# Patient Record
Sex: Female | Born: 1955 | Race: White | Hispanic: No | Marital: Married | Smoking: Never smoker
Health system: Southern US, Community
[De-identification: ages and names within clinical notes are randomized; demographics above are authoritative.]

## PROBLEM LIST (undated history)

## (undated) DIAGNOSIS — E079 Disorder of thyroid, unspecified: Secondary | ICD-10-CM

## (undated) DIAGNOSIS — E0590 Thyrotoxicosis, unspecified without thyrotoxic crisis or storm: Secondary | ICD-10-CM

## (undated) DIAGNOSIS — Z8639 Personal history of other endocrine, nutritional and metabolic disease: Secondary | ICD-10-CM

## (undated) HISTORY — DX: Personal history of other endocrine, nutritional and metabolic disease: Z86.39

## (undated) HISTORY — DX: Thyrotoxicosis, unspecified without thyrotoxic crisis or storm: E05.90

## (undated) HISTORY — DX: Disorder of thyroid, unspecified: E07.9

---

## 1998-06-11 ENCOUNTER — Other Ambulatory Visit: Admission: RE | Admit: 1998-06-11 | Discharge: 1998-06-11 | Payer: Self-pay | Admitting: Gynecology

## 1999-05-16 ENCOUNTER — Other Ambulatory Visit: Admission: RE | Admit: 1999-05-16 | Discharge: 1999-05-16 | Payer: Self-pay | Admitting: Gynecology

## 2000-04-20 ENCOUNTER — Encounter: Admission: RE | Admit: 2000-04-20 | Discharge: 2000-04-20 | Payer: Self-pay | Admitting: Sports Medicine

## 2000-06-01 ENCOUNTER — Encounter: Admission: RE | Admit: 2000-06-01 | Discharge: 2000-06-01 | Payer: Self-pay | Admitting: Sports Medicine

## 2000-07-06 ENCOUNTER — Other Ambulatory Visit: Admission: RE | Admit: 2000-07-06 | Discharge: 2000-07-06 | Payer: Self-pay | Admitting: Gynecology

## 2001-03-22 ENCOUNTER — Encounter: Admission: RE | Admit: 2001-03-22 | Discharge: 2001-03-22 | Payer: Self-pay | Admitting: Sports Medicine

## 2001-07-26 ENCOUNTER — Other Ambulatory Visit: Admission: RE | Admit: 2001-07-26 | Discharge: 2001-07-26 | Payer: Self-pay | Admitting: Gynecology

## 2002-08-12 ENCOUNTER — Other Ambulatory Visit: Admission: RE | Admit: 2002-08-12 | Discharge: 2002-08-12 | Payer: Self-pay | Admitting: Gynecology

## 2003-10-27 ENCOUNTER — Encounter: Admission: RE | Admit: 2003-10-27 | Discharge: 2003-10-27 | Payer: Self-pay | Admitting: Family Medicine

## 2004-08-22 ENCOUNTER — Encounter: Admission: RE | Admit: 2004-08-22 | Discharge: 2004-08-22 | Payer: Self-pay | Admitting: Cardiology

## 2005-01-17 ENCOUNTER — Ambulatory Visit: Payer: Self-pay | Admitting: Sports Medicine

## 2005-01-31 ENCOUNTER — Ambulatory Visit: Payer: Self-pay | Admitting: Sports Medicine

## 2005-03-17 ENCOUNTER — Ambulatory Visit: Payer: Self-pay | Admitting: Sports Medicine

## 2005-05-02 ENCOUNTER — Inpatient Hospital Stay (HOSPITAL_COMMUNITY): Admission: AD | Admit: 2005-05-02 | Discharge: 2005-05-03 | Payer: Self-pay | Admitting: Ophthalmology

## 2005-05-02 HISTORY — PX: RETINAL DETACHMENT SURGERY: SHX105

## 2005-06-05 ENCOUNTER — Other Ambulatory Visit: Admission: RE | Admit: 2005-06-05 | Discharge: 2005-06-05 | Payer: Self-pay | Admitting: Gynecology

## 2005-11-14 ENCOUNTER — Ambulatory Visit: Payer: Self-pay | Admitting: Sports Medicine

## 2005-11-19 IMAGING — CR DG CHEST 2V
2 series · 2 of 2 positions shown · non-contrast
Comparison: none

CLINICAL DATA: Cough.
 CHEST X-RAY:
 Two views of the chest show the lungs to be hyperaerated consistent with COPD.  The heart is within normal limits in size.  No bony abnormality is seen.

[view not recorded (1 of 2)]
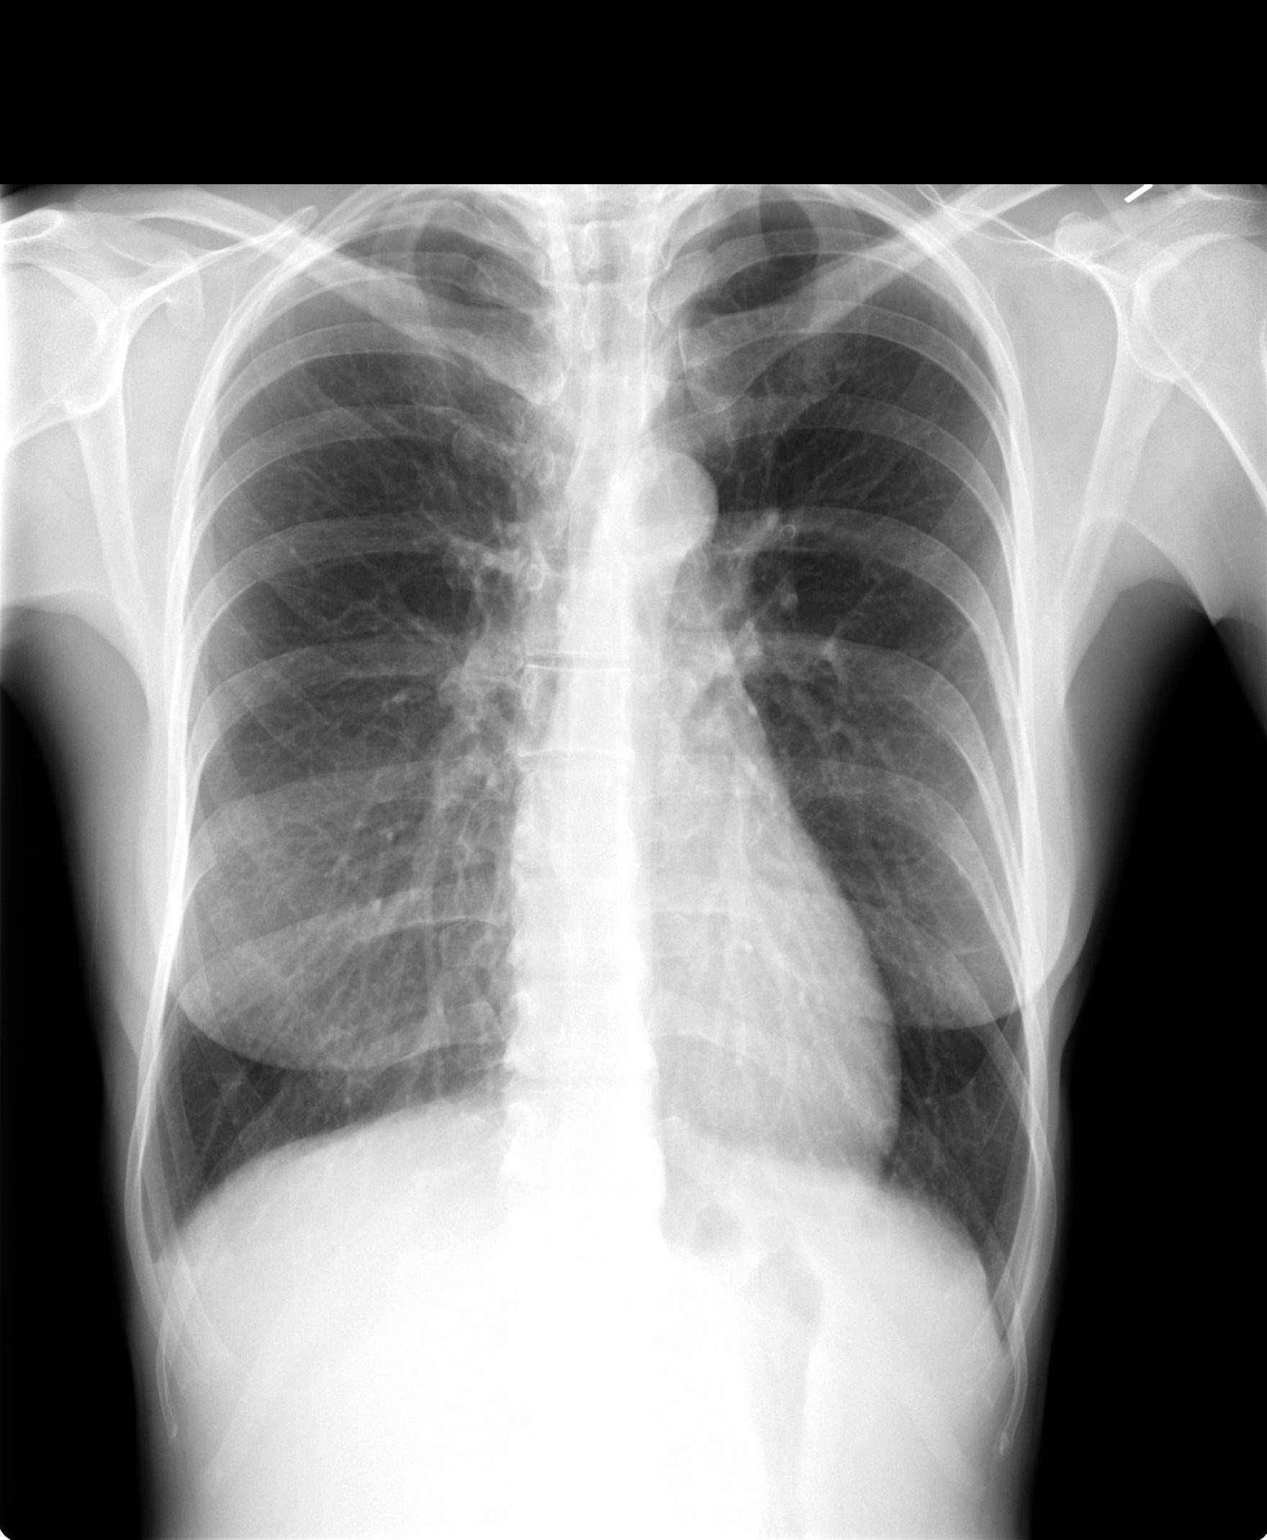

[view not recorded (2 of 2)]
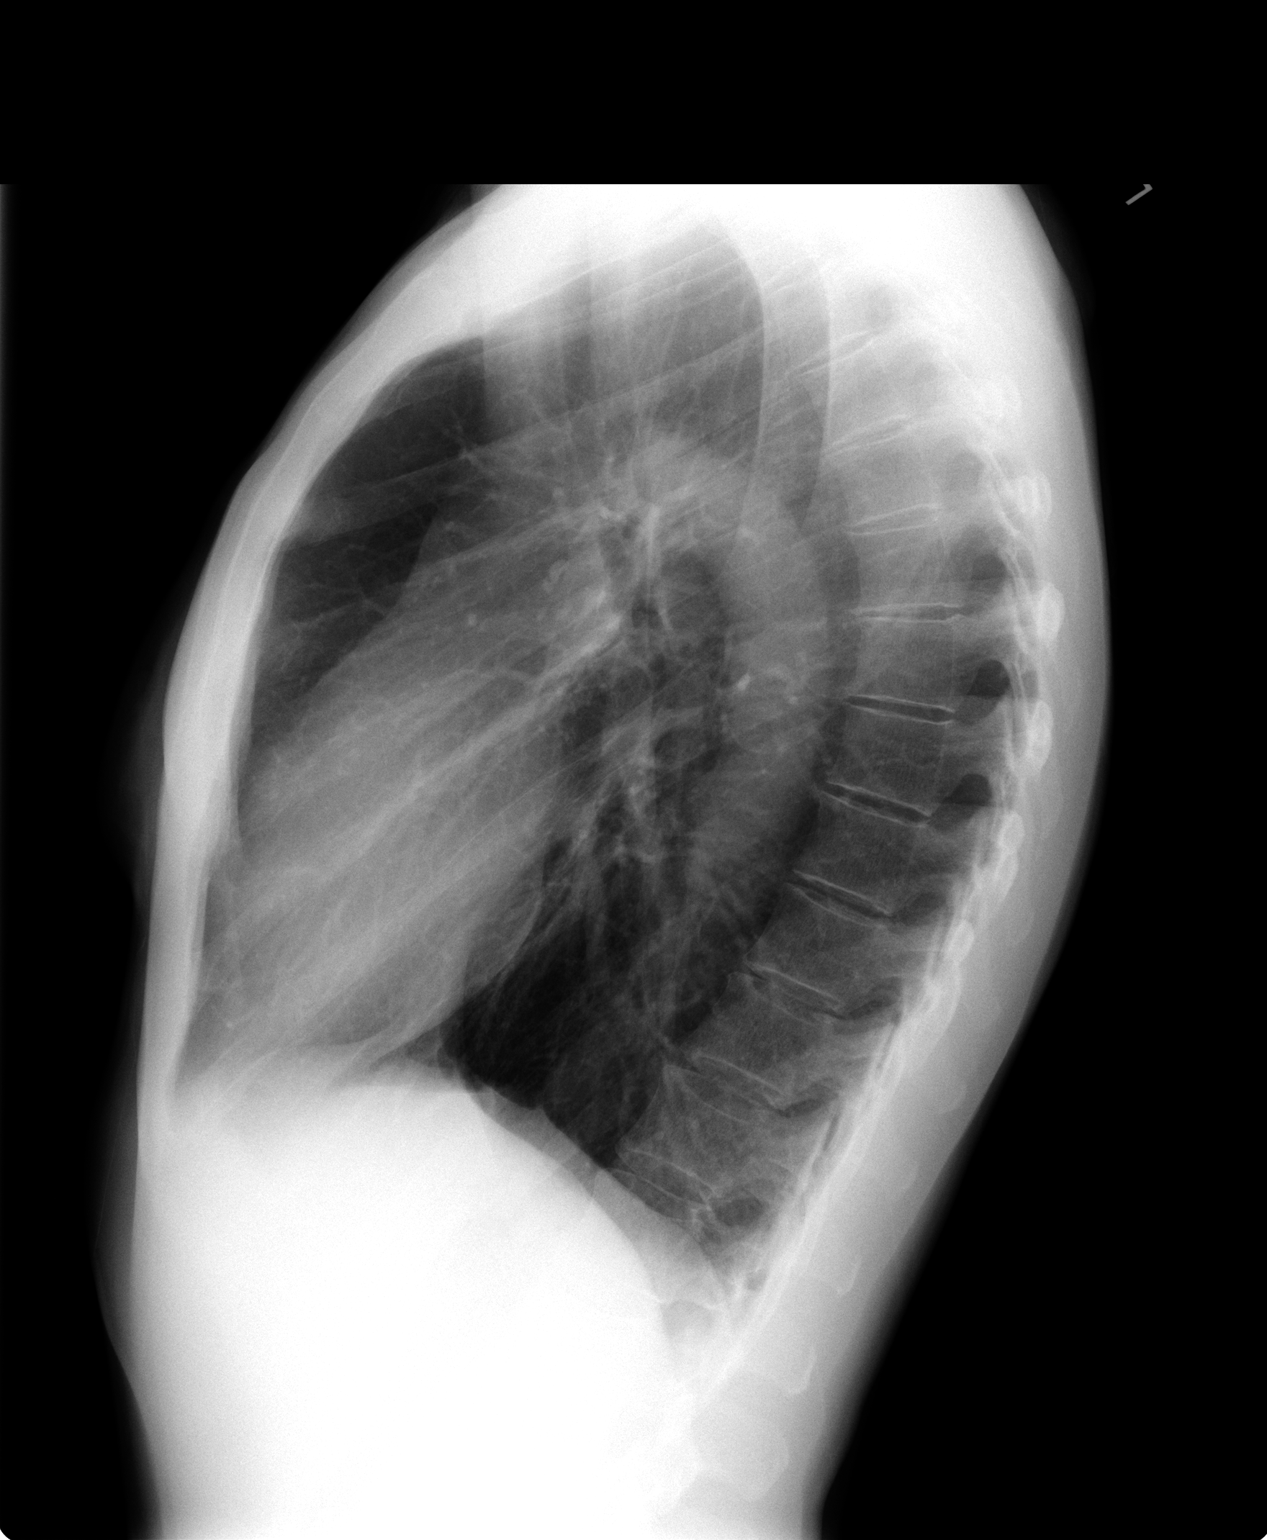

[2 of 2 positions shown; findings below may reference images not displayed]

IMPRESSION: Hyperaeration consistent with COPD.  No pneumonia.

## 2006-05-25 ENCOUNTER — Other Ambulatory Visit: Admission: RE | Admit: 2006-05-25 | Discharge: 2006-05-25 | Payer: Self-pay | Admitting: Gynecology

## 2006-06-18 ENCOUNTER — Emergency Department (HOSPITAL_COMMUNITY): Admission: EM | Admit: 2006-06-18 | Discharge: 2006-06-18 | Payer: Self-pay | Admitting: Emergency Medicine

## 2006-06-23 ENCOUNTER — Ambulatory Visit: Payer: Self-pay | Admitting: Sports Medicine

## 2006-07-30 IMAGING — CR DG CHEST 2V
2 series · 2 of 2 positions shown · non-contrast
Comparison: none

CLINICAL DATA: Pre-op for retinal detachment, scleral buckle left eye.  Non-smoker. No chest complaints. 
 CHEST - 2 VIEW: 
 PA and lateral views of the chest are made and are compared to previous studies of 08/22/04 and show mild diffuse peribronchial thickening, flattening of the hemidiaphragms and increased AP diameter of the chest.  There is some air trapping consistent with chronic obstructive pulmonary disease. There is no evidence of active disease.  The heart and mediastinum are normal.  The bony thorax is normal.

[view not recorded (1 of 2)]
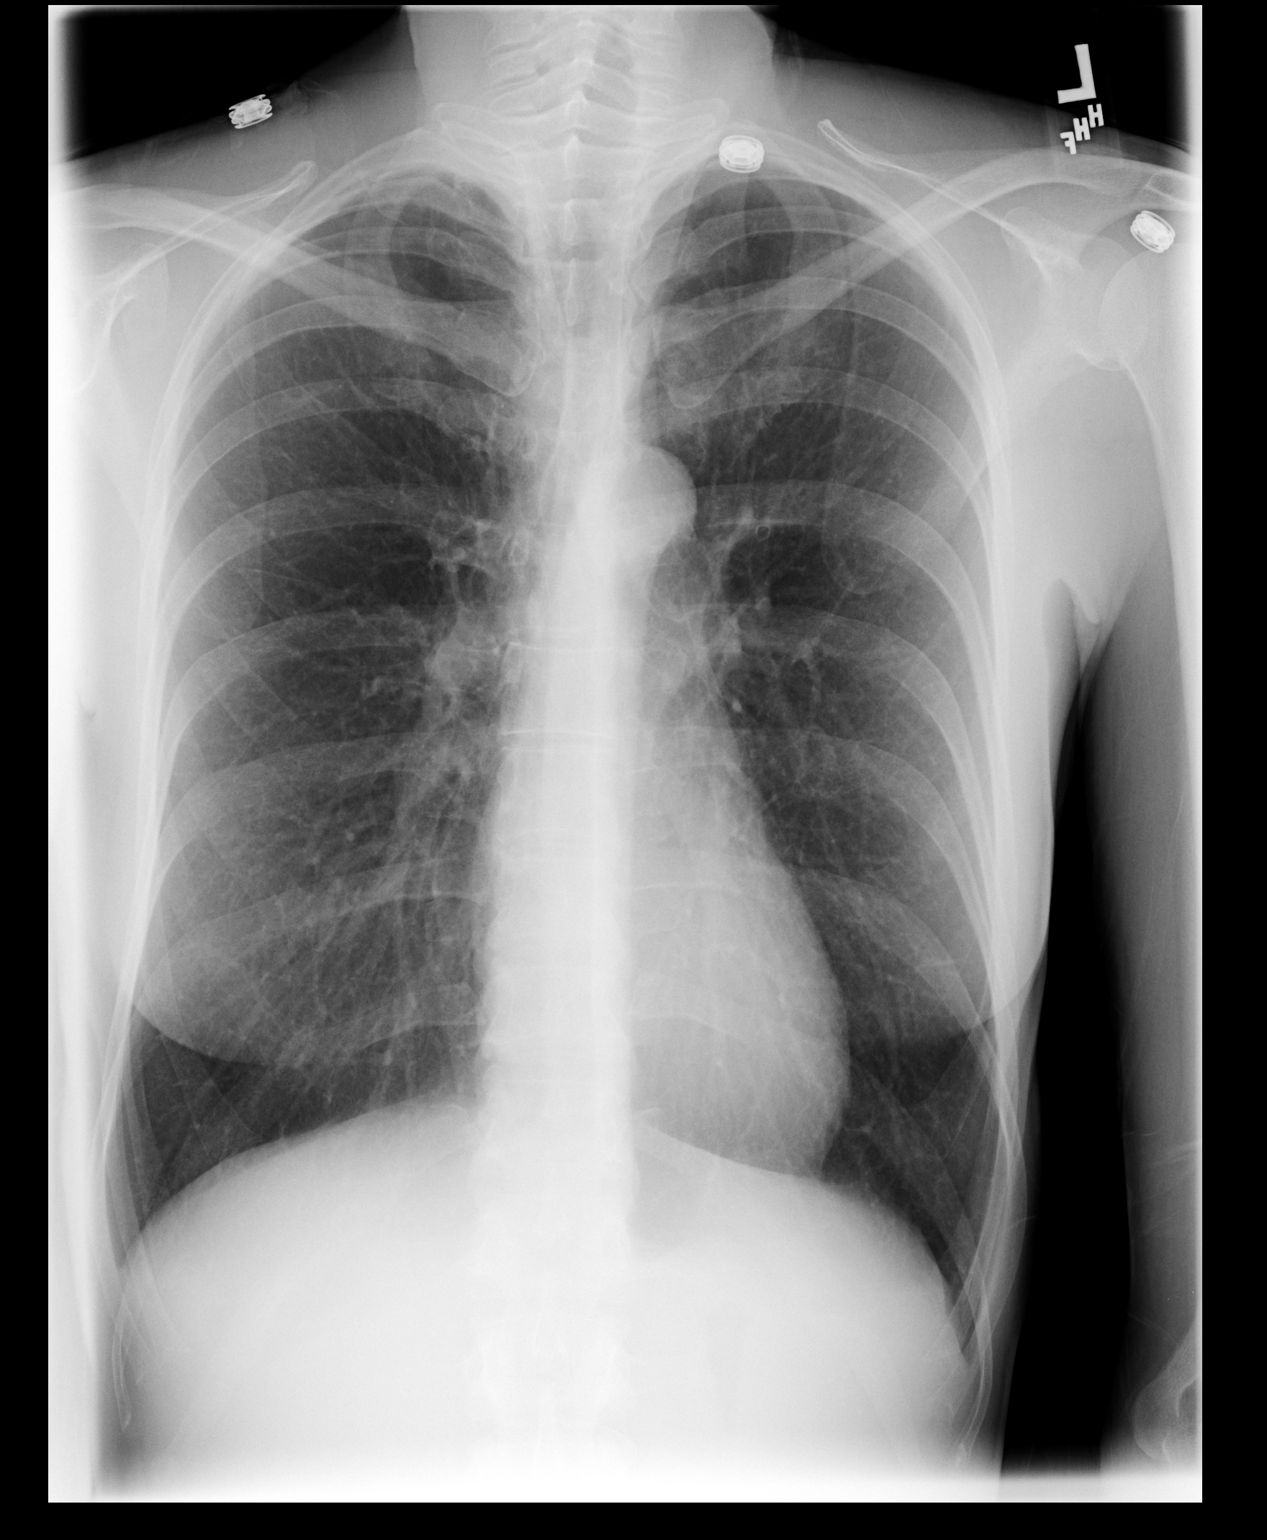

[view not recorded (2 of 2)]
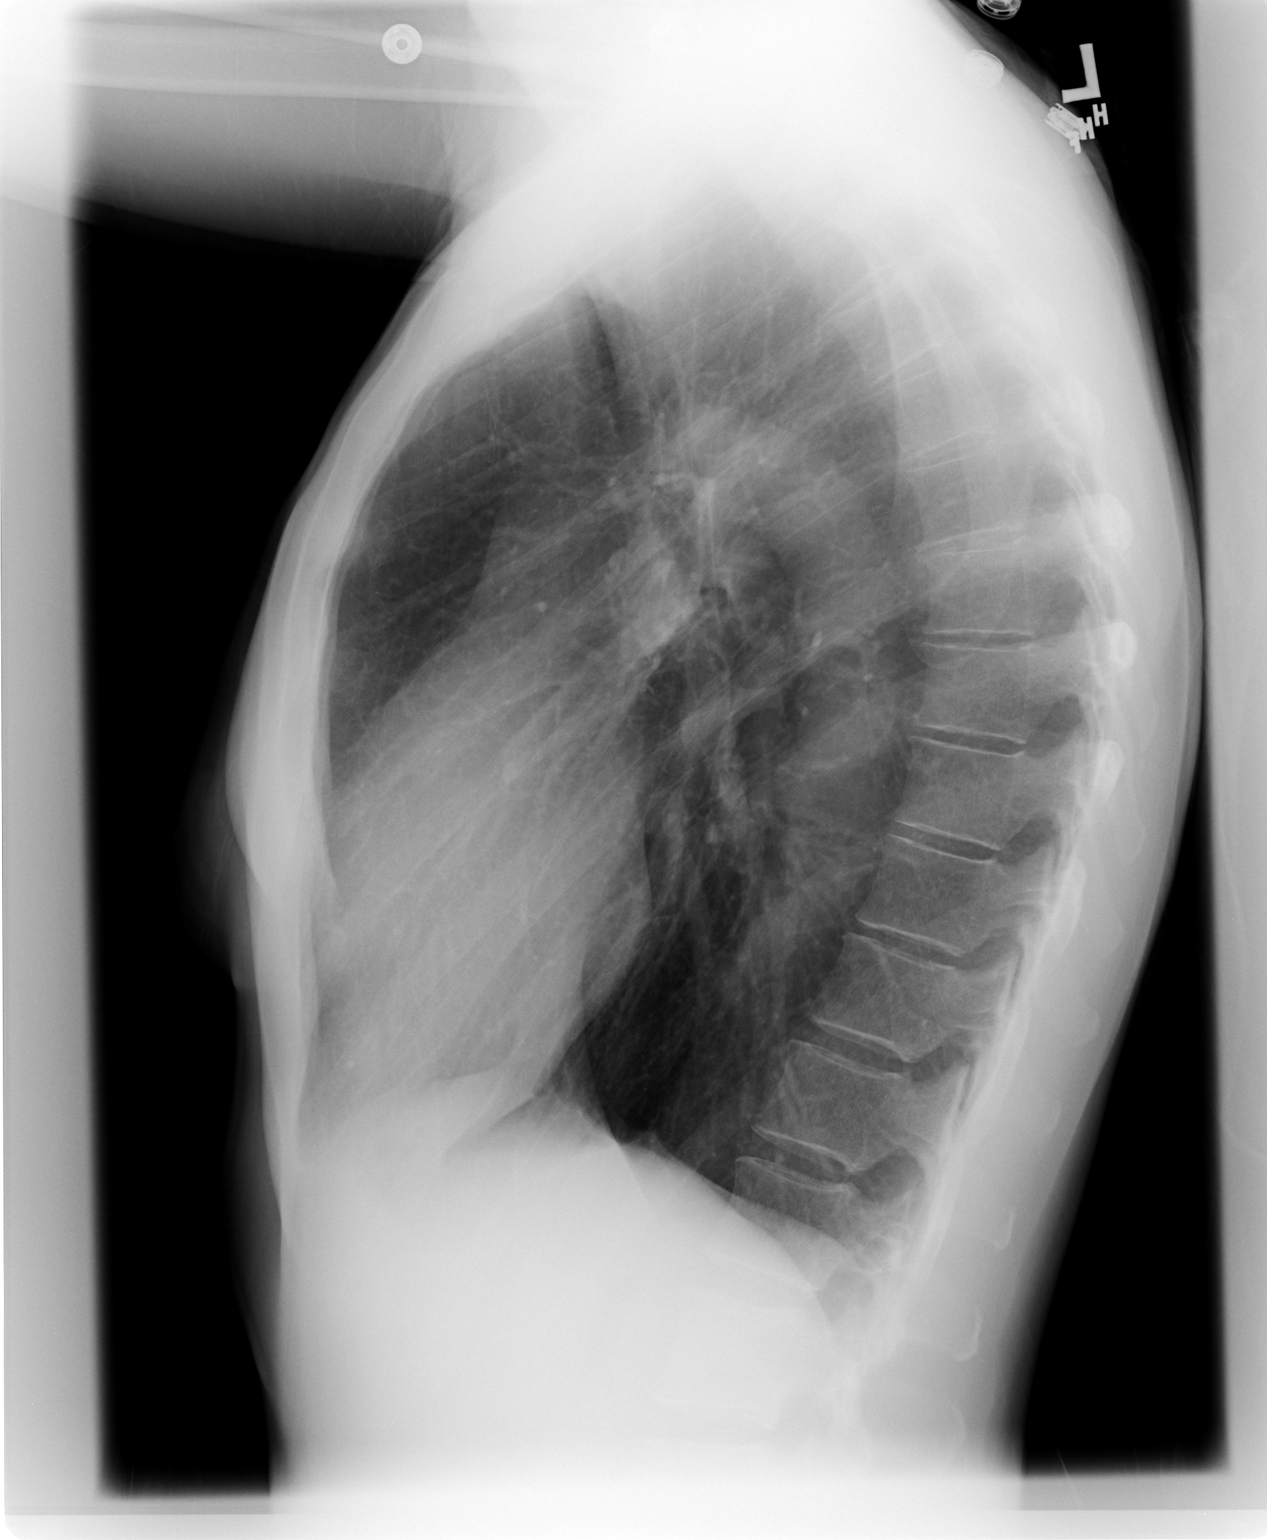

[2 of 2 positions shown; findings below may reference images not displayed]

IMPRESSION: COPD.  No evidence of active disease.

## 2007-05-31 ENCOUNTER — Other Ambulatory Visit: Admission: RE | Admit: 2007-05-31 | Discharge: 2007-05-31 | Payer: Self-pay | Admitting: Gynecology

## 2007-07-13 ENCOUNTER — Ambulatory Visit: Payer: Self-pay | Admitting: Sports Medicine

## 2007-07-13 DIAGNOSIS — K1370 Unspecified lesions of oral mucosa: Secondary | ICD-10-CM | POA: Insufficient documentation

## 2007-07-13 DIAGNOSIS — M629 Disorder of muscle, unspecified: Secondary | ICD-10-CM | POA: Insufficient documentation

## 2007-07-13 DIAGNOSIS — M7750 Other enthesopathy of unspecified foot: Secondary | ICD-10-CM | POA: Insufficient documentation

## 2007-12-09 ENCOUNTER — Telehealth: Payer: Self-pay | Admitting: Sports Medicine

## 2008-05-31 ENCOUNTER — Other Ambulatory Visit: Admission: RE | Admit: 2008-05-31 | Discharge: 2008-05-31 | Payer: Self-pay | Admitting: Gynecology

## 2008-06-06 ENCOUNTER — Ambulatory Visit: Payer: Self-pay | Admitting: Sports Medicine

## 2008-06-06 DIAGNOSIS — M79609 Pain in unspecified limb: Secondary | ICD-10-CM

## 2009-06-13 ENCOUNTER — Encounter: Admission: RE | Admit: 2009-06-13 | Discharge: 2009-06-13 | Payer: Self-pay | Admitting: Gynecology

## 2009-07-04 ENCOUNTER — Encounter (INDEPENDENT_AMBULATORY_CARE_PROVIDER_SITE_OTHER): Payer: Self-pay | Admitting: *Deleted

## 2009-07-10 ENCOUNTER — Ambulatory Visit: Payer: Self-pay | Admitting: Sports Medicine

## 2009-10-22 ENCOUNTER — Ambulatory Visit: Payer: Self-pay | Admitting: Sports Medicine

## 2009-10-22 DIAGNOSIS — IMO0002 Reserved for concepts with insufficient information to code with codable children: Secondary | ICD-10-CM

## 2010-01-17 ENCOUNTER — Ambulatory Visit: Payer: Self-pay | Admitting: Sports Medicine

## 2010-01-17 DIAGNOSIS — M948X9 Other specified disorders of cartilage, unspecified sites: Secondary | ICD-10-CM

## 2010-02-15 ENCOUNTER — Emergency Department (HOSPITAL_COMMUNITY): Admission: EM | Admit: 2010-02-15 | Discharge: 2010-02-15 | Payer: Self-pay | Admitting: Emergency Medicine

## 2010-02-16 ENCOUNTER — Ambulatory Visit (HOSPITAL_COMMUNITY): Admission: RE | Admit: 2010-02-16 | Discharge: 2010-02-16 | Payer: Self-pay | Admitting: Emergency Medicine

## 2010-02-16 ENCOUNTER — Ambulatory Visit: Payer: Self-pay | Admitting: Vascular Surgery

## 2010-02-16 ENCOUNTER — Encounter (INDEPENDENT_AMBULATORY_CARE_PROVIDER_SITE_OTHER): Payer: Self-pay | Admitting: Emergency Medicine

## 2010-09-25 ENCOUNTER — Ambulatory Visit: Payer: Self-pay | Admitting: Cardiology

## 2010-09-26 ENCOUNTER — Ambulatory Visit: Payer: Self-pay | Admitting: Sports Medicine

## 2010-09-26 DIAGNOSIS — M25579 Pain in unspecified ankle and joints of unspecified foot: Secondary | ICD-10-CM

## 2010-09-26 DIAGNOSIS — H547 Unspecified visual loss: Secondary | ICD-10-CM

## 2010-11-14 NOTE — Assessment & Plan Note (Signed)
Summary: RLQ PAIN   Vital Signs:  Patient profile:   55 year old female BP sitting:   132 / 74  Vitals Entered By: Lillia Pauls CMA (October 22, 2009 9:22 AM)  History of Present Illness: 55 yo runner her for evaluation of RLQ pain x 1 week.  Insidious onset, RLQ soreness worse with running, walking.  No obvious injury or association with change but has noted that she has been running a lot with a handless dog collar and having to pull on the dog leash a lot.  Feels oem bloating in the area which she associates with large meals, but otherwise no signs of illness.  Has taken some celebrex with minimal releif.  Review of Systems      See HPI  Physical Exam  General:  thin female, NAD Abdomen:  old LTCS scar.  Pain on palpation on inside of right iliac crest.  Painful with activation of abdominal oblique muscles.  No pain with hip flexion, extension, aductions, abduction.  No evidence of hernia.     Impression & Recommendations:  Problem # 1:  MUSCLE STRAIN, ABDOMINAL WALL (ICD-848.8) Strain of abdominal oblique/conjoint tendon.  No evidence of hernia.  Advised abdominal exercises to strengthen oblique muscles and to avoid running with dog to see if makes a difference.  Complete Medication List: 1)  Celebrex 200 Mg Caps (Celecoxib) .Marland Kitchen.. 1 by mouth two times a day prn

## 2010-11-14 NOTE — Assessment & Plan Note (Signed)
Summary: ANKLE/FOOT INJURY,MC   Vital Signs:  Patient profile:   55 year old female Pulse rate:   56 / minute BP sitting:   120 / 78  (right arm)  Vitals Entered By: Rochele Pages RN (September 26, 2010 3:36 PM) CC: rt anterior foot and ankle injury   CC:  rt anterior foot and ankle injury.  History of Present Illness: Pt reports to clinic for evaluation of rt anterior ankle and foot pain. 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  Rt eye- vision better than left eye.  Increased rt ankle injuries due to running on trails with her dog due to vision problems.  Ankle injuries started in June 2011, will improve with no running, but reinjures with running.   she runs with dog and that helps her see obstacles  Preventive Screening-Counseling & Management  Alcohol-Tobacco     Smoking Status: never  Social History: Smoking Status:  never  Physical Exam  General:  Well-developed,well-nourished,in no acute distress; alert,appropriate and cooperative throughout examination Msk:  left ankle shows no swelling; stable lateral and medial ligaments; squeeze test and kleiger test unremarkable; talar dome seems nontender; no sign of peroneal tendon subluxations; no pain at base of 5th MT. Rt ankle has slight swelling.  Ligaments stable, slight tenderness on kliger test 1 foot balance has poor balance bilaterally, but better on left     Impression & Recommendations:  Problem # 1:  ANKLE PAIN, BILATERAL (ICD-719.47)  Orders: Ankle Training Brace/ASO Support [phone]) Ankle Training Brace/ASO Support [phone])  must work 1 foot balance and cone touches  learn to stabilize ankles with eyes closed since she has so much visual loss  wear ankle supports for any running  Problem # 2:  VISUAL ACUITY, DECREASED (ICD-369.9) this is some type of uveitis  has marked impairment and cannot drive at present  encouraged to try to  run in as safe and level environment as possible   Complete Medication List: 1)  Celebrex 200 Mg Caps (Celecoxib) .Marland Kitchen.. 1 by mouth two times a day prn  Patient Instructions: 1)  Do balance exerces 10 time 10 seconds on each ankle  2)  Do cone touch 10 times each ankle- building up to 3 sets   Orders Added: 1)  Ankle Training Brace/ASO Support [L1902] 2)  Ankle Training Brace/ASO Support [L1902] 3)  Est. Patient Level III [09811]

## 2010-11-14 NOTE — Assessment & Plan Note (Signed)
Summary: ORTHOTICS/MJD   Vital Signs:  Patient profile:   55 year old female BP sitting:   106 / 70  Vitals Entered By: Lillia Pauls CMA (January 17, 2010 1:38 PM)  History of Present Illness: Pt presents for right foot pain that has been present for the past 2-3 weeks. She has most of the pain on the ball ofher right foot. She has been using MT pads and orthotics but has still had pain with any type of running especially with pushing off. She has not had pain specifically like this in the past. She has not tried any medications or icing. She is here for orthotic fabrication since she believes that her dog pulling on her while she has been running has broken down her orthotics significantly in the last year.   Physical Exam  General:  alert and well-developed.   Head:  normocephalic and no abnormalities observed.   Neck:  supple.   Lungs:  normal respiratory effort.   Msk:  Right Foot: Pes cavus with a narrow foot Transverse arch breakdown with the 2nd and 3rd MT heads palpable  + TTP over the lateral sesamoid recreating her pain Normal ankle ROM without difficulty 5/5 strength with resisted ankle ROM testing Difficulty bearing weight due to sesamoiditis  Left Foot Pes cavus with a narrow foot Transverse arch breakdown with the 2nd and 3rd MT heads palpable as well No TTP throughout including over the sesamoids Normal ankle ROM without difficulty 5/5 strength with resisted ankle ROM testing No difficulty bearing weight  Orthotics - signficant breakdown over the ball of the foot in the orthotics bilaterally   Impression & Recommendations:  Problem # 1:  SESAMOIDITIS (ICD-733.99) Assessment New  Likely due to having to land hard while being pulled forward by her dog during overstriding 1. Had new custom orthotics created for her today and had her 2 older pains revamped with new small MT pads and small blue pads placed underneath the first rays bilaterally  Patient was fitted  for a : standard, cushioned, semi-rigid orthotic. The orthotic was heated and afterward the patient stood on the orthotic blank positioned on the orthotic stand. The patient was positioned in subtalar neutral position and 10 degrees of ankle dorsiflexion in a weight bearing stance. After completion of molding, a stable base was applied to the orthotic blank. The blank was ground to a stable position for weight bearing. Size: 7 Red Base: Med blue EVA Posting: 1st Ray pads bilaterally Additional orthotic padding: small MT pads bilaterally  Patient had some difficulty with the older orthotics which required changes in position of the MT pads but she was almost happy with them when she left. She felt that the new orthotics were comfortable prior to leaving.  2. Ice her foot for 20 minutes daily 3. 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 1 by mouth two times a day prn

## 2010-12-29 ENCOUNTER — Encounter: Payer: Self-pay | Admitting: *Deleted

## 2010-12-30 ENCOUNTER — Other Ambulatory Visit: Payer: Self-pay

## 2011-02-28 NOTE — Op Note (Signed)
NAMEANNALISA, COLONNA              ACCOUNT NO.:  [id number]   MEDICAL RECORD NO.:  [id number]          PATIENT TYPE:  INP   LOCATION:  5731                         FACILITY:  [facility]   PHYSICIAN:  Guadelupe Sabin, M.D.DATE OF BIRTH:  1956-06-21   DATE OF PROCEDURE:  05/02/2005  DATE OF DISCHARGE:  05/03/2005                                 OPERATIVE REPORT   PREOPERATIVE DIAGNOSIS:  Rhegmatogenous retinal detachment, left eye single  defect.   POSTOPERATIVE DIAGNOSIS:  Rhegmatogenous retinal detachment, left eye single  defect   PROCEDURE:  Scleral buckling procedure, left eye, using solid silicone  implants number 277 and 240 with cryoapplication, diathermy application,  external drainage of subretinal fluid and intravitreal air injection.   SURGEON:  Guadelupe Sabin, M.D.   ASSISTANT:  Nurse.   ANESTHESIA:  General.   OPHTHALMOSCOPY:  As previously described.   OPERATIVE PROCEDURE:  After the patient was prepped and draped, a lid  traction suture was placed in the left upper and lower lids. A lid speculum  was inserted.  A peritomy was performed adjacent to the limbus 360 degrees.  The four rectus muscles were isolated and looped with 4-0 silk traction  suture. The sclera was inspected and felt to be slightly thin as anticipated  in this high myope.  It, however, was felt to of satisfactory thickness for  lamellar scleral dissection from the 11 to 2:30 position.  Indirect  ophthalmoscopy and localization of the retinal tear was performed.  The tear  itself was located at the 1 o'clock position approximately 15 mm from the  limbus.  Lamellar scleral dissection was then carried out from the 11 to  2:30, the bed measuring 9 mm in width.  Light diathermy applications were  applied.  A a total of three 4-0 green Mersilene sutures with 3-0 plain  catgut reinforcements were used to close the scleral flaps over a trimmed  number 277 solid silicone implant.  A number 240 solid  silicone encircling  band was placed in the groove of the implant and at the equator.  This was  tied with two sutures of 4-0 green Mersilene at the 7 o'clock position.  Anchoring sutures of 5-0 white Dacron were placed at the eight and 4 o'clock  position to hold the encircling band in place.  After repeat indirect  ophthalmoscopy, it was elected to drain fluid in the bed at the temporal  end.  Incision was made at the end of the implant, and there was a sudden a  gush of clear subretinal fluid. This continued.  The scleral flaps were  pulled up tight and further additional fluid drained through the drain site  at the end of the dissection.  The eye was becoming quite hypotenuse, and it  was elected to inject air through a 30 gauge needle at a pars plana  injection site at the 8 o'clock position 3.5 mm from the limbus.  This  reestablished the intraocular pressure.  The fundus was inspected revealing  a minimal subretinal fluid.  Approximately 1 mL of air was injected.  A good  buckling indentation was noted and the tear appeared to be a satisfactory  position on the implant surface. It was therefore elected to close.  The  tension of the encircling band had been adjusted and tenon's capsule was  pulled forward in the four quadrants as a separate layer.  Neosporin  ophthalmic solution was irrigated in the subtenon's tissue around the  implants.  Conjunctiva was pulled forward and closed with a running 6-0  chromic catgut suture with additional interrupted sutures at the temporal 3  o'clock position.  Depo Garamycin and dexamethasone were injected in the  subtenons space inferiorly.  Atropine and Maxitrol ointment were instilled  in the conjunctival cul-de-sac.  A light patch and protector shield were  applied.  The patient tolerated the 2-hour procedure under general anesthesia well and  was taken to the recovery room and subsequently to the 23-hour observation  unit.  The patient was  positioned on at her back at a 45 degree angle to  permit the intravitreal air to tamponade the horseshoe retinal tear at the 1  o'clock position.       HNJ/MEDQ  D:  05/02/2005  T:  05/03/2005  Job:  [id number]   cc:   Vernell Leep., M.D.  [address] [place]  [place] [address]  Fax: [phone]   Cassell Clement, M.D.  [address]  [place]  [place] [address]  Fax: [phone]

## 2011-02-28 NOTE — H&P (Signed)
Stacey Sanchez, Stacey Sanchez              ACCOUNT NO.:  [id number]   MEDICAL RECORD NO.:  [id number]          PATIENT TYPE:  INP   LOCATION:  5731                         FACILITY:  [facility]   PHYSICIAN:  Guadelupe Sabin, M.D.DATE OF BIRTH:  Sep 12, 1956   DATE OF ADMISSION:  05/02/2005  DATE OF DISCHARGE:                                HISTORY & PHYSICAL   REASON FOR ADMISSION:  This was an urgent outpatient admission of this 55-  year-old, white female admitted with a rhegmatogenous retinal detachment of  the left eye.   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  The patient was in good visual health until she  noted approximately 1 week ago the sudden onset of floaters in her left eye  and subsequent blurred vision.  She was seen by Dr. Donalda Sanchez, her  [facility] ophthalmologist, and found to have a rhegmatogenous retinal  detachment.  The patient was referred to my office where this diagnosis was  confirmed and arrangements made for her outpatient admission.   PAST MEDICAL HISTORY:  The patient is in stable good general health under  the care of Dr. Patty Sanchez, her regular physician.  He is felt that the  patient was in satisfactory condition for the proposed retina surgery under  general anesthesia.  The patient currently takes Synthroid tablets and birth  control tablets under his direction.   FAMILY HISTORY:  Positive with an aunt having a retinal detachment.   ALLERGIES:  AMOXICILLIN ANTIBIOTIC.   REVIEW OF SYSTEMS:  No cardiorespiratory complaints.   PHYSICAL EXAMINATION:  GENERAL:  The patient is a pleasant, well-nourished,  well-developed, white female in acute ocular distress.  HEENT:  EYES:  The eyes are slightly prominent and the patient has a history  of Graves' disease.  This prominence, however, may be due to her long axial  length due to  her high myopia.  Visual acuity with correction right eye  20/20, left eye without correction finger counting.  Applanation tonometry  left eye 16 mm at 5:30 p.m.  Slit lamp examination with the eyes white and  clear.  The patient is wearing a soft myopic contact lens in the right eye.  She has removed the left lens.  The corneas are clear anterior chamber deep  and clear.  Lens clear.  Dilated detailed fundus examination of left eye  with mild vitreous hemorrhage present.  There is a retinal detachment in the  upper temporal quadrant with a large horseshoe tear at the 2 o'clock  position.  The retinal detachment comes extremely close to the temporal  macula.  Optic nerve is sharply outlined of good color, disk/cup ratio 0.4-  5.  CHEST/LUNGS:  Clear to percussion and auscultation.  HEART:  Normal sinus rhythm, no cardiomegaly.  No murmurs.  ABDOMEN:  Negative.  EXTREMITIES:  Negative.   ADMISSION DIAGNOSES:  1.  Rhegmatogenous retinal detachment, left eye.  2.  High myopia, both eyes.   PLAN:  Surgical plan  for scleral buckling procedure, left eye, with possible  vitrectomy.  The patient has been given oral discussion and printed  information concerning the operative procedure and its possible  complications.  She has signed an informed consent and arrangements made for  her outpatient admission at this time.       HNJ/MEDQ  D:  05/02/2005  T:  05/03/2005  Job:  [id number]   cc:   Stacey Sanchez, M.D.  [address]  [place]  [place] [address]  Fax: [phone]   Stacey Sanchez, M.D.  [place], [place]

## 2011-04-23 ENCOUNTER — Telehealth: Payer: Self-pay | Admitting: Cardiology

## 2011-04-23 DIAGNOSIS — Z79899 Other long term (current) drug therapy: Secondary | ICD-10-CM

## 2011-04-23 NOTE — Telephone Encounter (Signed)
Pt wanted to talke you about blood work please call

## 2011-04-30 ENCOUNTER — Other Ambulatory Visit (INDEPENDENT_AMBULATORY_CARE_PROVIDER_SITE_OTHER): Payer: BC Managed Care – PPO | Admitting: *Deleted

## 2011-04-30 ENCOUNTER — Other Ambulatory Visit: Payer: Self-pay | Admitting: *Deleted

## 2011-04-30 DIAGNOSIS — E7800 Pure hypercholesterolemia, unspecified: Secondary | ICD-10-CM

## 2011-04-30 DIAGNOSIS — Z79899 Other long term (current) drug therapy: Secondary | ICD-10-CM

## 2011-04-30 LAB — BASIC METABOLIC PANEL
BUN: 26 mg/dL — ABNORMAL HIGH (ref 6–23)
CO2: 33 mEq/L — ABNORMAL HIGH (ref 19–32)
Calcium: 9.4 mg/dL (ref 8.4–10.5)
Chloride: 104 mEq/L (ref 96–112)
Creatinine, Ser: 0.8 mg/dL (ref 0.4–1.2)
GFR: 75.48 mL/min (ref >60.00)
Glucose, Bld: 78 mg/dL (ref 70–99)
Potassium: 5.2 mEq/L — ABNORMAL HIGH (ref 3.5–5.1)
Sodium: 143 mEq/L (ref 135–145)

## 2011-04-30 LAB — HEPATIC FUNCTION PANEL
ALT: 25 U/L (ref 0–35)
AST: 29 U/L (ref 0–37)
Albumin: 4.3 g/dL (ref 3.5–5.2)
Alkaline Phosphatase: 115 U/L (ref 39–117)
Bilirubin, Direct: 0 mg/dL (ref 0.0–0.3)
Total Bilirubin: 0.6 mg/dL (ref 0.3–1.2)
Total Protein: 6.7 g/dL (ref 6.0–8.3)

## 2011-05-01 ENCOUNTER — Telehealth: Payer: Self-pay | Admitting: *Deleted

## 2011-05-01 NOTE — Telephone Encounter (Signed)
Called about lab results, left message to call back

## 2011-05-02 ENCOUNTER — Telehealth: Payer: Self-pay | Admitting: Cardiology

## 2011-05-02 NOTE — Telephone Encounter (Signed)
Message copied by Burnell Blanks on Fri May 02, 2011 10:09 AM ------      Message from: Cassell Clement      Created: Wed Apr 30, 2011  9:27 PM       Please report.  The liver tests are normal.  The kidney tests are normal except the potassium is a little high at 5.2 and the BUN is slightly high at 26.  She needs to drink more water particularly in the weather to correct these deficiencies.

## 2011-05-02 NOTE — Telephone Encounter (Signed)
Returned your phone call from yesterday. Please call back. I have pulled her chart.

## 2011-05-02 NOTE — Telephone Encounter (Signed)
Advised of labs and to increase water and decrease potassium intake

## 2011-05-02 NOTE — Progress Notes (Signed)
Advised patient

## 2011-05-26 ENCOUNTER — Telehealth: Payer: Self-pay | Admitting: Cardiology

## 2011-05-26 NOTE — Telephone Encounter (Signed)
Called and gave names of rheumatologist in town.  Will call back if needs [place] to call.

## 2011-05-26 NOTE — Telephone Encounter (Signed)
Wants to be referred to a rheumatologist.  Has a question regarding that.

## 2011-06-10 ENCOUNTER — Telehealth: Payer: Self-pay | Admitting: Cardiology

## 2011-06-10 DIAGNOSIS — E039 Hypothyroidism, unspecified: Secondary | ICD-10-CM

## 2011-06-10 MED ORDER — LEVOTHYROXINE SODIUM 100 MCG PO TABS: 100.0000 ug | ORAL_TABLET | Freq: Every day | ORAL | Status: DC

## 2011-06-10 NOTE — Telephone Encounter (Signed)
Called and wanted to discuss labs and upcoming appoiontment

## 2011-06-10 NOTE — Telephone Encounter (Signed)
Wants to speak with RN regarding her labwork

## 2011-06-13 ENCOUNTER — Telehealth: Payer: Self-pay | Admitting: Cardiology

## 2011-06-19 ENCOUNTER — Telehealth: Payer: Self-pay | Admitting: Cardiology

## 2011-06-19 NOTE — Telephone Encounter (Signed)
Received Med Rec Request for Disability Determination Services.  Placed on [facility] desk to be picked up next Tuesday.

## 2011-06-30 ENCOUNTER — Telehealth: Payer: Self-pay | Admitting: Cardiology

## 2011-06-30 NOTE — Telephone Encounter (Signed)
Per pt call, pt had test done and needs to discuss with nurse. Please return call to discuss further.

## 2011-06-30 NOTE — Telephone Encounter (Signed)
HDL 83 LDL 115  TOTAL 218 TRIG 100  Unable to get PCP prior to going to [place] to live with her brother and does not have time to find PCP.  Is good on the current dose of synthroid, but if you want to change is ok with that.  Please advise

## 2011-07-01 NOTE — Telephone Encounter (Signed)
Okay to stay on present dose of thyroid medication for now.  When she returns to [facility] I would like her to see an endocrinologist to help manage her thyroid issues.

## 2011-12-23 ENCOUNTER — Encounter: Payer: Self-pay | Admitting: *Deleted

## 2013-10-04 ENCOUNTER — Encounter: Payer: BC Managed Care – PPO | Admitting: Sports Medicine

## 2013-10-04 ENCOUNTER — Encounter: Payer: Self-pay | Admitting: Sports Medicine

## 2013-10-04 ENCOUNTER — Ambulatory Visit (INDEPENDENT_AMBULATORY_CARE_PROVIDER_SITE_OTHER): Payer: BC Managed Care – PPO | Admitting: Sports Medicine

## 2013-10-04 VITALS — BP 106/70 | Ht 66.0 in | Wt 105.0 lb

## 2013-10-04 DIAGNOSIS — M948X9 Other specified disorders of cartilage, unspecified sites: Secondary | ICD-10-CM

## 2013-10-04 DIAGNOSIS — M7750 Other enthesopathy of unspecified foot: Secondary | ICD-10-CM

## 2013-10-04 NOTE — Progress Notes (Signed)
Patient ID: DEVONE BONILLA, female   DOB: 20-Feb-1956, 57 y.o.   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 but still able to run  She was placed in rigid orthotics in [place] pelvic also much foot pain that she not able to run  She is able to walk in the rigid orthotics  She has several pairs of her orthotics that we had made that she still can run in but they are worn badly and MT pads need change   Physical examination Thin female in no acute distress BP 106/70  Ht 5\' 6"  (1.676 m)  Wt 105 lb (47.628 kg)  BMI 16.96 kg/m2  She has a long narrow foot Transverse arch shows some collapse Slight valgus deviation of the first toes Increased degenerative change of the first MTP Tenderness on the plantar surface of MTP joints bilaterally  Long arch is preserved Heel pad is somewhat thinned

## 2013-10-04 NOTE — Assessment & Plan Note (Signed)
She should continue using sesamoid pads in all running and walking shoes  She will need to adjust this in biking shoes if she resumes biking

## 2013-10-04 NOTE — Assessment & Plan Note (Signed)
Patient was fitted for a : standard, cushioned, semi-rigid orthotic. The orthotic was heated and afterward the patient stood on the orthotic blank positioned on the orthotic stand. The patient was positioned in subtalar neutral position and 10 degrees of ankle dorsiflexion in a weight bearing stance. After completion of molding, a stable base was applied to the orthotic blank. The blank was ground to a stable position for weight bearing. Size: 8 red EVA Base: blue med eva Posting: sessamoid pads Additional orthotic padding: heel pads/  Addition of small MT pads  Preparation time 45 minutes  Running gait on completion shows that she has a for foot strike With excellent running form She was pain free in this orthotic

## 2014-10-04 ENCOUNTER — Ambulatory Visit (INDEPENDENT_AMBULATORY_CARE_PROVIDER_SITE_OTHER): Payer: BC Managed Care – PPO | Admitting: Sports Medicine

## 2014-10-04 ENCOUNTER — Encounter: Payer: Self-pay | Admitting: Sports Medicine

## 2014-10-04 VITALS — BP 125/81 | HR 46 | Ht 66.0 in | Wt 104.0 lb

## 2014-10-04 DIAGNOSIS — M25579 Pain in unspecified ankle and joints of unspecified foot: Secondary | ICD-10-CM

## 2014-10-04 NOTE — Assessment & Plan Note (Signed)
This seems to have resolved with some corrections to the orthotics

## 2014-10-04 NOTE — Assessment & Plan Note (Signed)
First MTP pads added bilaterally  These gave good comfort

## 2014-10-04 NOTE — Progress Notes (Signed)
Patient ID: Stacey BearCatherine S Sanchez, female   DOB: 24-Dec-1955, 58 y.o.   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  Hx of Graves disease On [place] thyroid  Took cytoxan for autoimmune suppression Remicade was not helpful  Now on low dose naltrexone for elevated titers (High RF and other titers) This has really helped the muscular and joint sxs  We have kept her in orthotics for years because of problems with metatarsalgia of and with chronic foot and ankle pain The orthotics and generally controlled this Recently she had a pain full flare with her left foot This radiate from that he'll only to the end of the foot and was diffuse rather than in a specific location  pain went away when she started on the Cytoxan  She comes for new orthotics as she is welcome running about 10 miles a day  [place] now [place] be close to her eye specialist as she is nearly blind  She stays active and 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

## 2014-10-04 NOTE — Assessment & Plan Note (Signed)
Patient was fitted for a : standard, cushioned, semi-rigid orthotic. The orthotic was heated and afterward the patient stood on the orthotic blank positioned on the orthotic stand. The patient was positioned in subtalar neutral position and 10 degrees of ankle dorsiflexion in a weight bearing stance. After completion of molding, a stable base was applied to the orthotic blank. The blank was ground to a stable position for weight bearing. Size: 8 red EVA Base: blue small EVA Posting: 1st MTP Additional orthotic padding: Heel pads  Added small MT pads bilat  Evaluation and preparation time today -50 minutes  Gait was comfortable with complete control pronation at the end of the procedure
